# Patient Record
Sex: Male | Born: 1980 | Race: Asian | Hispanic: No | Marital: Married | Smoking: Current every day smoker
Health system: Southern US, Community
[De-identification: ages and names within clinical notes are randomized; demographics above are authoritative.]

## PROBLEM LIST (undated history)

## (undated) DIAGNOSIS — H1010 Acute atopic conjunctivitis, unspecified eye: Secondary | ICD-10-CM

## (undated) HISTORY — DX: Acute atopic conjunctivitis, unspecified eye: H10.10

---

## 2013-11-03 ENCOUNTER — Ambulatory Visit (INDEPENDENT_AMBULATORY_CARE_PROVIDER_SITE_OTHER): Payer: No Typology Code available for payment source

## 2013-11-03 ENCOUNTER — Ambulatory Visit (INDEPENDENT_AMBULATORY_CARE_PROVIDER_SITE_OTHER): Payer: No Typology Code available for payment source | Admitting: Sports Medicine

## 2013-11-03 ENCOUNTER — Encounter: Payer: Self-pay | Admitting: Sports Medicine

## 2013-11-03 VITALS — BP 104/66 | HR 77 | Ht 71.0 in | Wt 151.0 lb

## 2013-11-03 DIAGNOSIS — M25569 Pain in unspecified knee: Secondary | ICD-10-CM

## 2013-11-03 DIAGNOSIS — M222X2 Patellofemoral disorders, left knee: Secondary | ICD-10-CM | POA: Insufficient documentation

## 2013-11-03 DIAGNOSIS — M25572 Pain in left ankle and joints of left foot: Secondary | ICD-10-CM | POA: Insufficient documentation

## 2013-11-03 DIAGNOSIS — Z299 Encounter for prophylactic measures, unspecified: Secondary | ICD-10-CM | POA: Insufficient documentation

## 2013-11-03 DIAGNOSIS — M25579 Pain in unspecified ankle and joints of unspecified foot: Secondary | ICD-10-CM

## 2013-11-03 MED ORDER — MELOXICAM 15 MG PO TABS: ORAL_TABLET | ORAL | Status: AC

## 2013-11-03 NOTE — Assessment & Plan Note (Addendum)
Home rehabilitation exercises, x-rays, Mobic.

## 2013-11-03 NOTE — Assessment & Plan Note (Signed)
Etiology is unclear but he does have some arthritic symptoms. Pain is localized to the base of the fourth metatarsal bone. X-rays, Mobic as above, home rehabilitation exercises.

## 2013-11-03 NOTE — Assessment & Plan Note (Signed)
Checking routine bloodwork. 

## 2013-11-03 NOTE — Progress Notes (Signed)
  Subjective:    CC: Establish care.   HPI:  This is a very pleasant 33 year old male he has a couple of complaints.  Left ankle pain: Present for decades, intermittent, he does recall a fall from height several decades ago but never saw a doctor, he localizes his pain at the base of the fourth metatarsal dorsally, worse with weightbearing, no swelling, it doesn't interfere with daily activities.  Left knee pain: Localized over the anterior knee, moderate, persistent, worse with squatting, going up and down stairs.  Past medical history, Surgical history, Family history not pertinant except as noted below, Social history, Allergies, and medications have been entered into the medical record, reviewed, and no changes needed.   Review of Systems: No headache, visual changes, nausea, vomiting, diarrhea, constipation, dizziness, abdominal pain, skin rash, fevers, chills, night sweats, swollen lymph nodes, weight loss, chest pain, body aches, joint swelling, muscle aches, shortness of breath, mood changes, visual or auditory hallucinations.  Objective:    General: Well Developed, well nourished, and in no acute distress.  Neuro: Alert and oriented x3, extra-ocular muscles intact, sensation grossly intact.  HEENT: Normocephalic, atraumatic, pupils equal round reactive to light, neck supple, no masses, no lymphadenopathy, thyroid nonpalpable.  Skin: Warm and dry, no rashes noted.  Cardiac: Regular rate and rhythm, no murmurs rubs or gallops.  Respiratory: Clear to auscultation bilaterally. Not using accessory muscles, speaking in full sentences.  Abdominal: Soft, nontender, nondistended, positive bowel sounds, no masses, no organomegaly.  Left Ankle: No visible erythema or swelling. Range of motion is full in all directions. Strength is 5/5 in all directions. Stable lateral and medial ligaments; squeeze test and kleiger test unremarkable; Talar dome nontender; No pain at base of 5th MT; No  tenderness over cuboid; No tenderness over N spot or navicular prominence No tenderness on posterior aspects of lateral and medial malleolus No sign of peroneal tendon subluxations or tenderness to palpation Only minimal tenderness to palpation at the base of the fourth metatarsal and cuboid articulation. Negative tarsal tunnel tinel's Left Knee: Normal to inspection with no erythema or effusion or obvious bony abnormalities. Tender to palpation under the medial patellar facet. ROM full in flexion and extension and lower leg rotation. Ligaments with solid consistent endpoints including ACL, PCL, LCL, MCL. Negative Mcmurray's, Apley's, and Thessalonian tests. Non painful patellar compression. Patellar glide without crepitus. Patellar and quadriceps tendons unremarkable. Hamstring and quadriceps strength is normal.   X-rays of the left ankle and left knee were reviewed, they are negative.  Impression and Recommendations:    The patient was counselled, risk factors were discussed, anticipatory guidance given.

## 2013-11-09 ENCOUNTER — Encounter: Payer: No Typology Code available for payment source | Admitting: Sports Medicine

## 2013-11-17 ENCOUNTER — Encounter: Payer: Self-pay | Admitting: Sports Medicine

## 2013-11-17 ENCOUNTER — Ambulatory Visit (INDEPENDENT_AMBULATORY_CARE_PROVIDER_SITE_OTHER): Payer: No Typology Code available for payment source | Admitting: Sports Medicine

## 2013-11-17 VITALS — BP 83/60 | HR 77 | Ht 71.0 in | Wt 149.0 lb

## 2013-11-17 DIAGNOSIS — H1010 Acute atopic conjunctivitis, unspecified eye: Secondary | ICD-10-CM | POA: Insufficient documentation

## 2013-11-17 DIAGNOSIS — H1045 Other chronic allergic conjunctivitis: Secondary | ICD-10-CM

## 2013-11-17 DIAGNOSIS — Z299 Encounter for prophylactic measures, unspecified: Secondary | ICD-10-CM

## 2013-11-17 DIAGNOSIS — Z0000 Encounter for general adult medical examination without abnormal findings: Secondary | ICD-10-CM

## 2013-11-17 MED ORDER — ERYTHROMYCIN 5 MG/GM OP OINT: 1.0000 "application " | TOPICAL_OINTMENT | Freq: Three times a day (TID) | OPHTHALMIC | Status: AC

## 2013-11-17 MED ORDER — AZELASTINE HCL 0.05 % OP SOLN: 2.0000 [drp] | Freq: Two times a day (BID) | OPHTHALMIC | Status: AC

## 2013-11-17 NOTE — Assessment & Plan Note (Signed)
Complete physical today. He is going to get his blood work done today, he is fasting.

## 2013-11-17 NOTE — Progress Notes (Signed)
  Subjective:    CC: Complete physical   HPI:  This pleasant 33 year old male is here for complete physical exam, he also is fasting and would like to do his blood work today.  Itchy eyes: Present yearly around this time, watery, no upper respiratory symptoms, moderate control with Zyrtec. Moderate, persistent symptoms. No visual changes, no constitutional symptoms.  Past medical history, Surgical history, Family history not pertinant except as noted below, Social history, Allergies, and medications have been entered into the medical record, reviewed, and no changes needed.   Review of Systems: No headache, visual changes, nausea, vomiting, diarrhea, constipation, dizziness, abdominal pain, skin rash, fevers, chills, night sweats, swollen lymph nodes, weight loss, chest pain, body aches, joint swelling, muscle aches, shortness of breath, mood changes, visual or auditory hallucinations.  Objective:    General: Well Developed, well nourished, and in no acute distress.  Neuro: Alert and oriented x3, extra-ocular muscles intact, sensation grossly intact.  HEENT: Normocephalic, atraumatic, pupils equal round reactive to light, neck supple, no masses, no lymphadenopathy, thyroid nonpalpable. There is watery drainage with mild erythema of both conjunctiva. Skin: Warm and dry, no rashes noted.  Cardiac: Regular rate and rhythm, no murmurs rubs or gallops.  Respiratory: Clear to auscultation bilaterally. Not using accessory muscles, speaking in full sentences.  Abdominal: Soft, nontender, nondistended, positive bowel sounds, no masses, no organomegaly.  Musculoskeletal: Shoulder, elbow, wrist, hip, knee, ankle stable, and with full range of motion.  Impression and Recommendations:    The patient was counselled, risk factors were discussed, anticipatory guidance given.

## 2013-11-17 NOTE — Assessment & Plan Note (Signed)
Topical Optivar. He did have some symptoms suggestive of an infectious conjunctivitis, I am going to add a few days of topical erythromycin ointment. Return as needed for this, continue Zyrtec.

## 2013-11-18 LAB — CBC
HCT: 45.3 % (ref 39.0–52.0)
Hemoglobin: 15.8 g/dL (ref 13.0–17.0)
MCH: 31.5 pg (ref 26.0–34.0)
MCHC: 34.9 g/dL (ref 30.0–36.0)
MCV: 90.4 fL (ref 78.0–100.0)
Platelets: 253 K/uL (ref 150–400)
RBC: 5.01 MIL/uL (ref 4.22–5.81)
RDW: 14 % (ref 11.5–15.5)
WBC: 7.3 10*3/uL (ref 4.0–10.5)

## 2013-11-18 LAB — LIPID PANEL
Cholesterol: 140 mg/dL (ref 0–200)
HDL: 50 mg/dL (ref >39)
LDL Cholesterol: 76 mg/dL (ref 0–99)
Total CHOL/HDL Ratio: 2.8 Ratio
Triglycerides: 69 mg/dL (ref <150)
VLDL: 14 mg/dL (ref 0–40)

## 2013-11-18 LAB — COMPREHENSIVE METABOLIC PANEL
ALT: 17 U/L (ref 0–53)
Alkaline Phosphatase: 42 U/L (ref 39–117)
BUN: 19 mg/dL (ref 6–23)
CO2: 26 mEq/L (ref 19–32)
Calcium: 9.5 mg/dL (ref 8.4–10.5)
Chloride: 102 mEq/L (ref 96–112)
Creat: 0.92 mg/dL (ref 0.50–1.35)
Glucose, Bld: 82 mg/dL (ref 70–99)
Total Bilirubin: 1.3 mg/dL — ABNORMAL HIGH (ref 0.2–1.2)

## 2013-11-18 LAB — COMPREHENSIVE METABOLIC PANEL WITH GFR
AST: 18 U/L (ref 0–37)
Albumin: 4.7 g/dL (ref 3.5–5.2)
Potassium: 3.9 meq/L (ref 3.5–5.3)
Sodium: 138 meq/L (ref 135–145)
Total Protein: 6.9 g/dL (ref 6.0–8.3)

## 2013-11-18 LAB — HEMOGLOBIN A1C
Hgb A1c MFr Bld: 5.2 % (ref <5.7)
Mean Plasma Glucose: 103 mg/dL (ref <117)

## 2013-11-18 LAB — TSH: TSH: 1.383 u[IU]/mL (ref 0.350–4.500)

## 2013-12-23 ENCOUNTER — Ambulatory Visit (INDEPENDENT_AMBULATORY_CARE_PROVIDER_SITE_OTHER): Payer: No Typology Code available for payment source | Admitting: Sports Medicine

## 2013-12-23 ENCOUNTER — Encounter: Payer: Self-pay | Admitting: Sports Medicine

## 2013-12-23 VITALS — BP 102/66 | HR 79 | Ht 71.0 in | Wt 147.0 lb

## 2013-12-23 DIAGNOSIS — H1045 Other chronic allergic conjunctivitis: Secondary | ICD-10-CM

## 2013-12-23 DIAGNOSIS — R197 Diarrhea, unspecified: Secondary | ICD-10-CM

## 2013-12-23 DIAGNOSIS — K529 Noninfective gastroenteritis and colitis, unspecified: Secondary | ICD-10-CM | POA: Insufficient documentation

## 2013-12-23 DIAGNOSIS — H1010 Acute atopic conjunctivitis, unspecified eye: Secondary | ICD-10-CM

## 2013-12-23 NOTE — Assessment & Plan Note (Signed)
Patient does desire referral to gastroenterologist for colonoscopy. I have not yet done a workup, but I think this is an appropriate request.

## 2013-12-23 NOTE — Progress Notes (Signed)
  Subjective:    CC: Diarrhea  HPI: This is a very pleasant 33 year old male, healthy, he tells me he said several years of watery diarrhea, 3+ times per day. No abdominal cramping, pain. He really doesn't want me to do much of what, he would rather just be referred to gastroenterology and tells me he wants a colonoscopy. I think this is fine. Symptoms are moderate, persistent.  Allergic conjunctivitis: Resolved with Optivar.  Past medical history, Surgical history, Family history not pertinant except as noted below, Social history, Allergies, and medications have been entered into the medical record, reviewed, and no changes needed.   Review of Systems: No fevers, chills, night sweats, weight loss, chest pain, or shortness of breath.   Objective:    General: Well Developed, well nourished, and in no acute distress.  Neuro: Alert and oriented x3, extra-ocular muscles intact, sensation grossly intact.  HEENT: Normocephalic, atraumatic, pupils equal round reactive to light, neck supple, no masses, no lymphadenopathy, thyroid nonpalpable.  Skin: Warm and dry, no rashes. Cardiac: Regular rate and rhythm, no murmurs rubs or gallops, no lower extremity edema.  Respiratory: Clear to auscultation bilaterally. Not using accessory muscles, speaking in full sentences. Abdomen: Soft, nontender, nondistended, normal bowel sounds, without masses, guarding, rigidity.  Impression and Recommendations:

## 2013-12-23 NOTE — Assessment & Plan Note (Signed)
Doing well with Optivar.

## 2013-12-26 ENCOUNTER — Encounter: Payer: Self-pay | Admitting: Internal Medicine

## 2013-12-27 ENCOUNTER — Ambulatory Visit: Payer: No Typology Code available for payment source | Admitting: Sports Medicine

## 2013-12-27 DIAGNOSIS — Z0289 Encounter for other administrative examinations: Secondary | ICD-10-CM

## 2014-02-27 ENCOUNTER — Ambulatory Visit: Payer: No Typology Code available for payment source | Admitting: Internal Medicine

## 2014-03-01 ENCOUNTER — Telehealth: Payer: Self-pay

## 2014-03-01 DIAGNOSIS — K529 Noninfective gastroenteritis and colitis, unspecified: Secondary | ICD-10-CM

## 2014-03-01 NOTE — Telephone Encounter (Signed)
Patient spouse called requested a referral for Colonoscopy. She stated that she went to [facility] and she did not like their attitude she would perfer someone in [place] if it was possible. Lacy Taglieri,CMA

## 2014-03-02 NOTE — Telephone Encounter (Signed)
New referral placed.

## 2014-03-30 ENCOUNTER — Encounter: Payer: Self-pay | Admitting: Sports Medicine

## 2014-05-19 ENCOUNTER — Telehealth: Payer: Self-pay | Admitting: Sports Medicine

## 2014-05-19 DIAGNOSIS — K529 Noninfective gastroenteritis and colitis, unspecified: Secondary | ICD-10-CM

## 2014-05-19 NOTE — Telephone Encounter (Signed)
That sounds bogus, but..nonetheless referral placed.

## 2014-05-19 NOTE — Telephone Encounter (Signed)
Patients wife called stating they need a another referral to [facility] for a f/u appt with them because their insurance requires a referral for each appt

## 2015-10-02 ENCOUNTER — Ambulatory Visit (INDEPENDENT_AMBULATORY_CARE_PROVIDER_SITE_OTHER): Payer: No Typology Code available for payment source | Admitting: Sports Medicine

## 2015-10-02 DIAGNOSIS — Z23 Encounter for immunization: Secondary | ICD-10-CM | POA: Diagnosis not present

## 2015-10-02 NOTE — Progress Notes (Signed)
Pt's BP was low today, looking back at history not far from his trending BP. Pt does report he has not had breakfast today but is not symptomatic.

## 2015-12-31 IMAGING — CR DG KNEE 1-2V*R*
4 series · 4 of 4 positions shown · non-contrast
Comparison: None.

CLINICAL DATA: Left knee pain.

EXAM:
RIGHT KNEE - 1-2 VIEW

[view not recorded (1 of 4)]
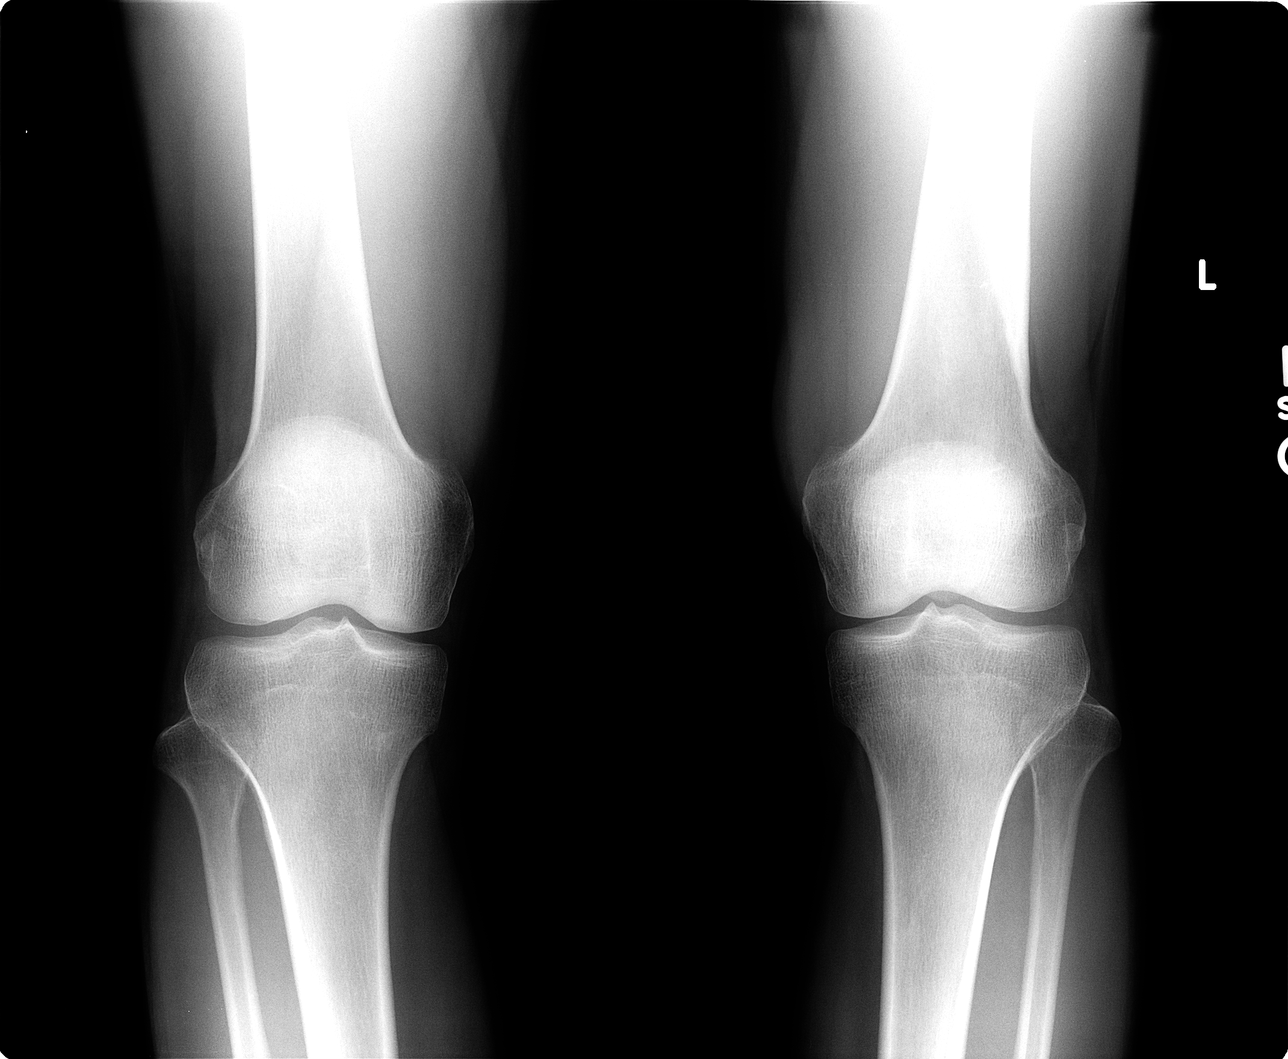

[view not recorded (2 of 4)]
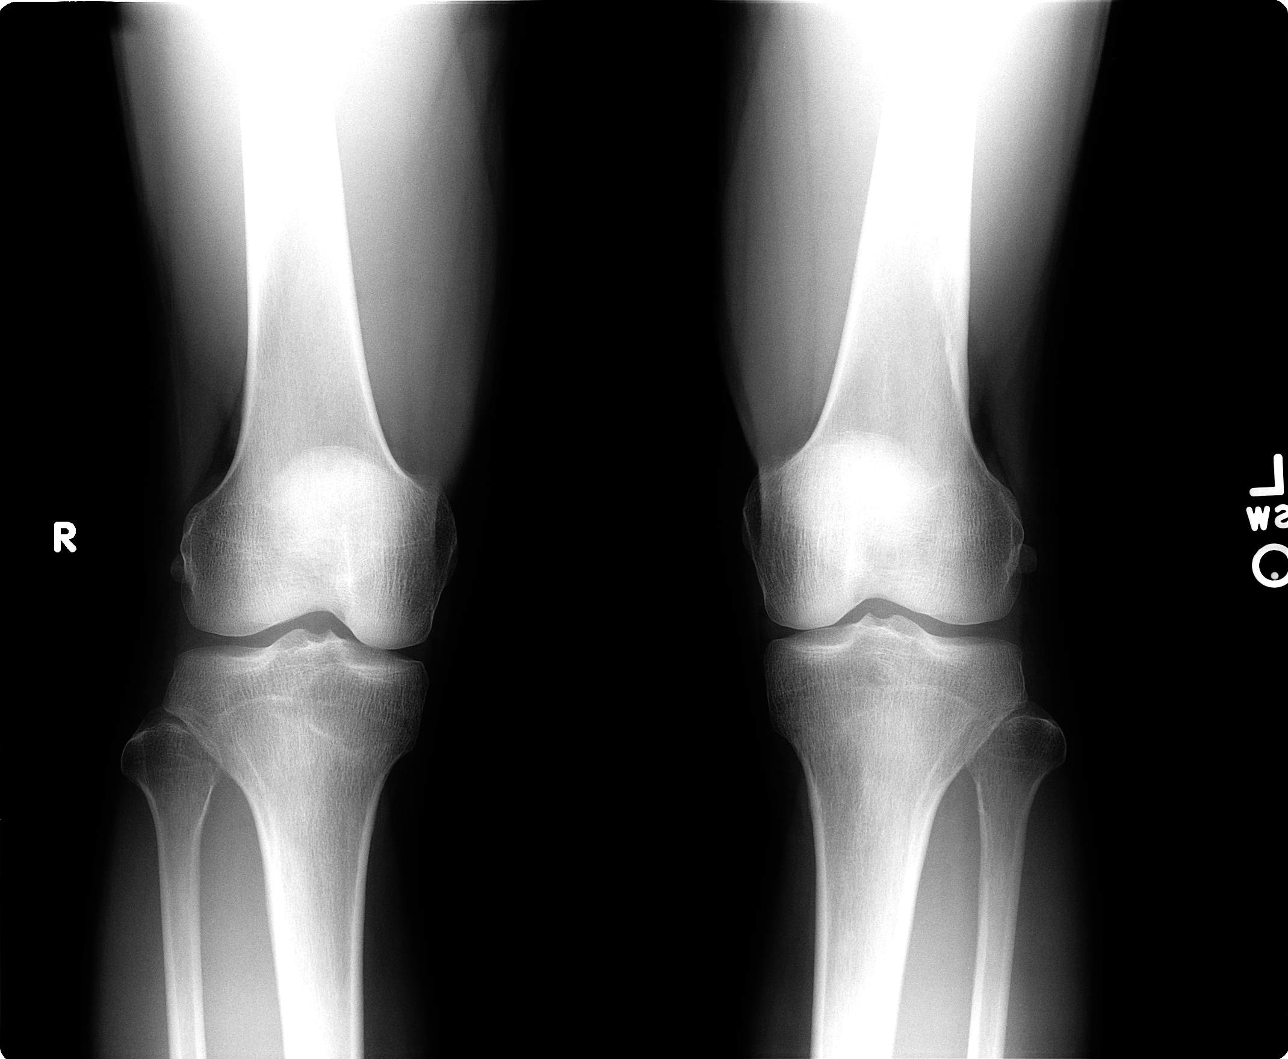

[view not recorded (3 of 4)]
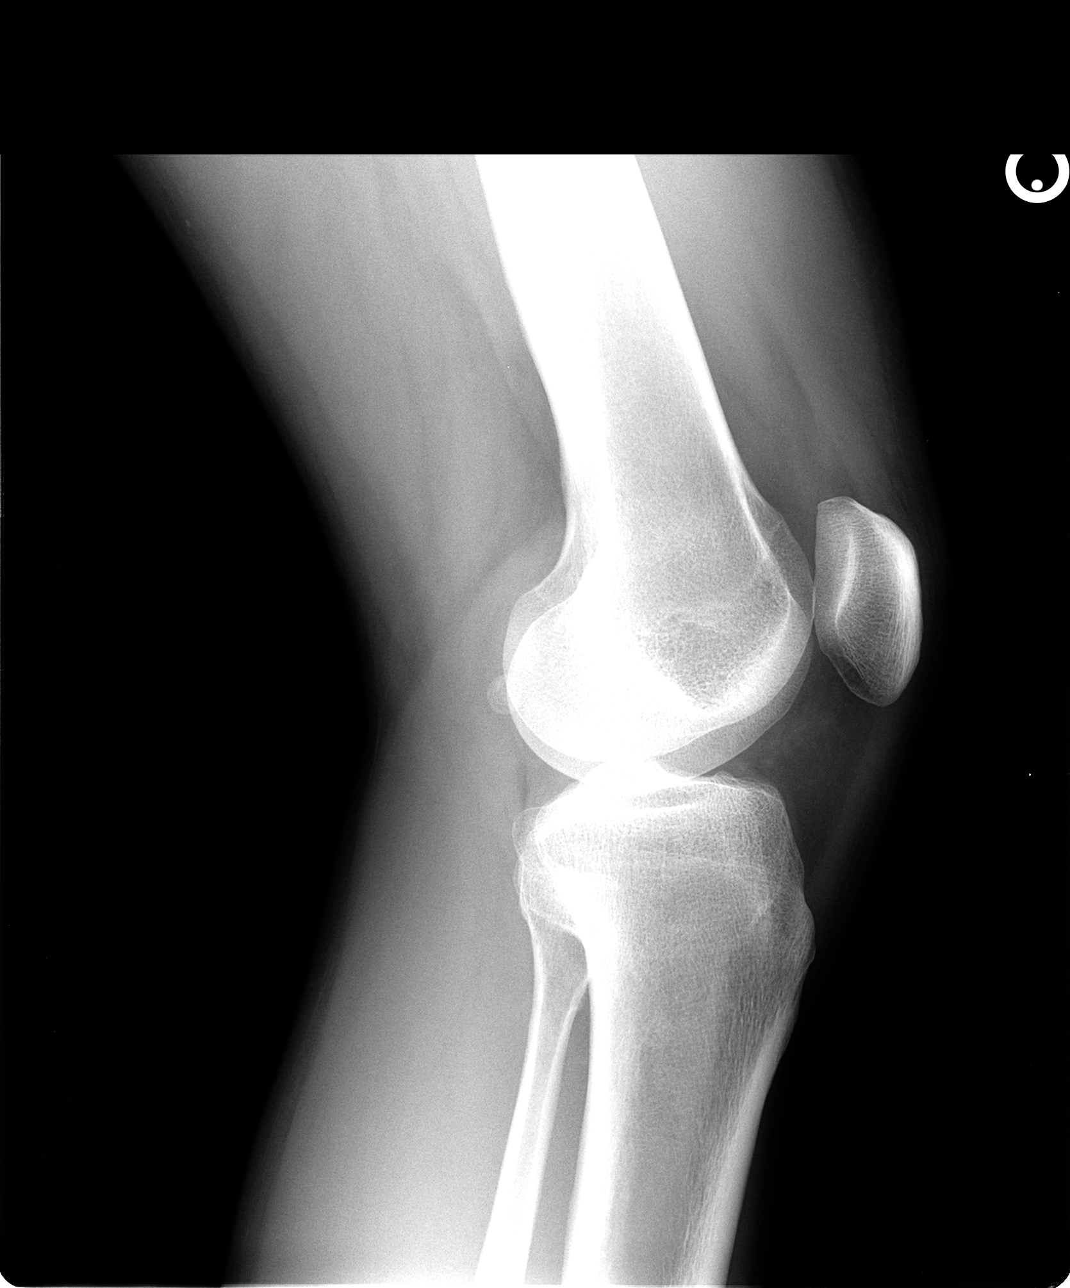

[view not recorded (4 of 4)]
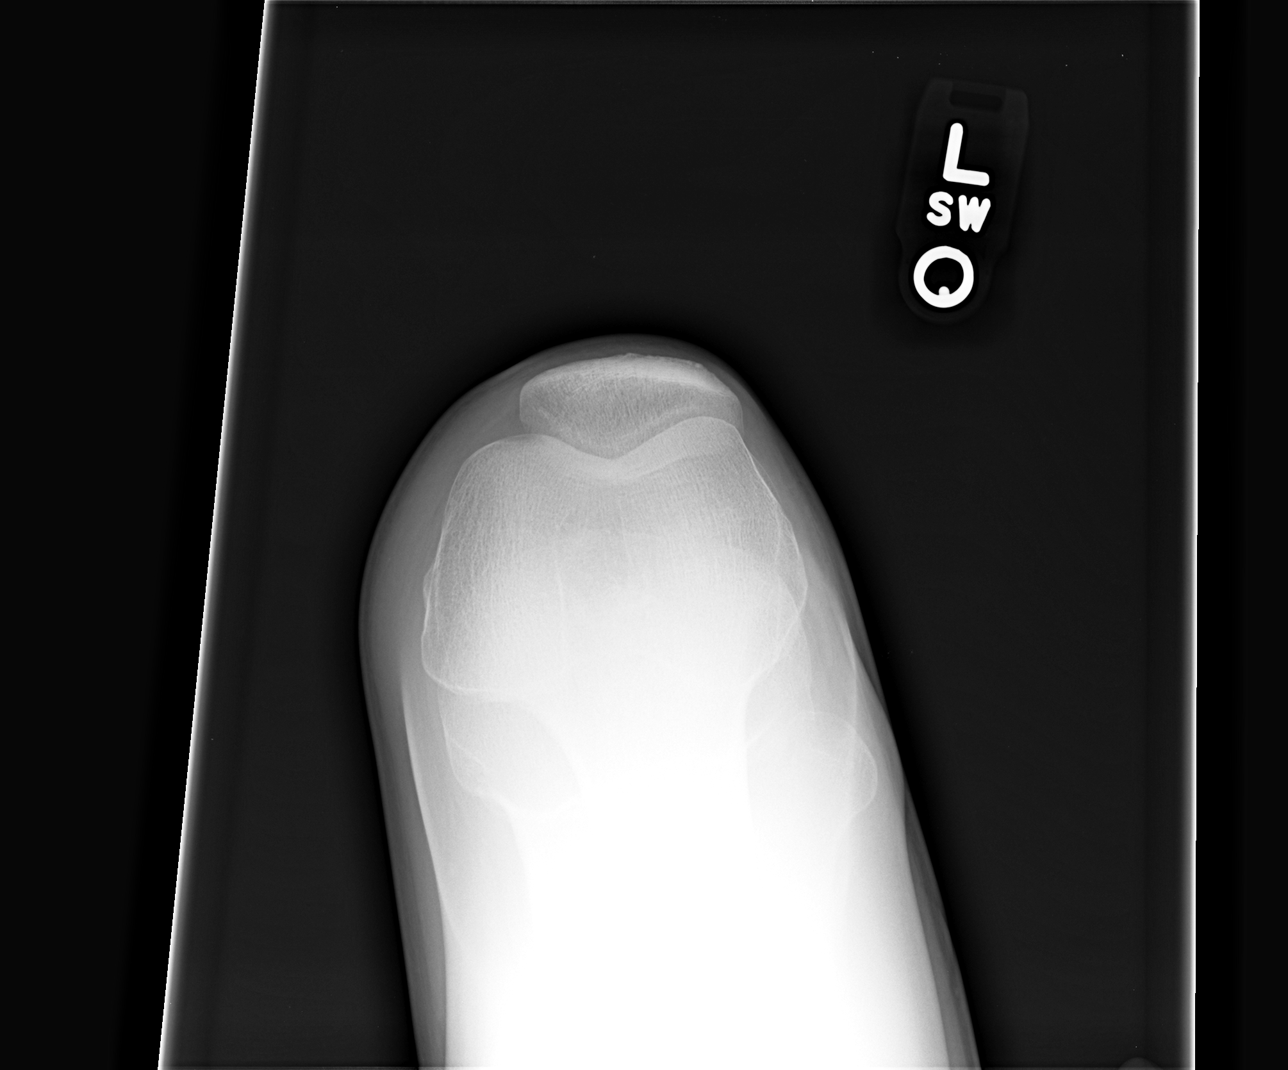

[4 of 4 positions shown; findings below may reference images not displayed]

FINDINGS: There is no joint space narrowing or osteophyte formation. The
appearance is essentially identical to the appearance of the left
knee in these projections.
IMPRESSION: Normal appearing right knee.

## 2024-06-23 ENCOUNTER — Telehealth: Payer: Self-pay | Admitting: Hematology

## 2024-06-23 NOTE — Telephone Encounter (Signed)
 Called in to scheduled a lab appt. Confirm time and dates

## 2024-06-24 ENCOUNTER — Inpatient Hospital Stay: Attending: Sports Medicine
